# Patient Record
Sex: Female | Born: 1995 | Race: White | Hispanic: Yes | Smoking: Never smoker
Health system: Southern US, Community
[De-identification: ages and names within clinical notes are randomized; demographics above are authoritative.]

---

## 2019-09-08 ENCOUNTER — Emergency Department (HOSPITAL_COMMUNITY)
Admission: EM | Admit: 2019-09-08 | Discharge: 2019-09-08 | Disposition: A | Discharge status: Home / Self Care | Payer: No Typology Code available for payment source | Attending: Emergency Medicine | Admitting: Emergency Medicine

## 2019-09-08 ENCOUNTER — Other Ambulatory Visit: Payer: Self-pay

## 2019-09-08 ENCOUNTER — Encounter (HOSPITAL_COMMUNITY): Payer: Self-pay | Admitting: Emergency Medicine

## 2019-09-08 ENCOUNTER — Emergency Department (HOSPITAL_COMMUNITY): Payer: No Typology Code available for payment source

## 2019-09-08 DIAGNOSIS — Z23 Encounter for immunization: Secondary | ICD-10-CM | POA: Insufficient documentation

## 2019-09-08 DIAGNOSIS — M25562 Pain in left knee: Secondary | ICD-10-CM | POA: Insufficient documentation

## 2019-09-08 DIAGNOSIS — Y939 Activity, unspecified: Secondary | ICD-10-CM | POA: Insufficient documentation

## 2019-09-08 DIAGNOSIS — R519 Headache, unspecified: Secondary | ICD-10-CM | POA: Insufficient documentation

## 2019-09-08 DIAGNOSIS — R040 Epistaxis: Secondary | ICD-10-CM | POA: Diagnosis not present

## 2019-09-08 DIAGNOSIS — Y929 Unspecified place or not applicable: Secondary | ICD-10-CM | POA: Insufficient documentation

## 2019-09-08 DIAGNOSIS — S70212A Abrasion, left hip, initial encounter: Secondary | ICD-10-CM | POA: Diagnosis not present

## 2019-09-08 DIAGNOSIS — S70211A Abrasion, right hip, initial encounter: Secondary | ICD-10-CM | POA: Diagnosis not present

## 2019-09-08 DIAGNOSIS — R109 Unspecified abdominal pain: Secondary | ICD-10-CM | POA: Insufficient documentation

## 2019-09-08 DIAGNOSIS — M25561 Pain in right knee: Secondary | ICD-10-CM | POA: Insufficient documentation

## 2019-09-08 DIAGNOSIS — Y999 Unspecified external cause status: Secondary | ICD-10-CM | POA: Insufficient documentation

## 2019-09-08 LAB — I-STAT CHEM 8, ED
BUN: 8 mg/dL (ref 6–20)
Calcium, Ion: 1.17 mmol/L (ref 1.15–1.40)
Chloride: 109 mmol/L (ref 98–111)
Creatinine, Ser: 0.7 mg/dL (ref 0.44–1.00)
Glucose, Bld: 109 mg/dL — ABNORMAL HIGH (ref 70–99)
HCT: 44 % (ref 36.0–46.0)
Hemoglobin: 15 g/dL (ref 12.0–15.0)
Potassium: 3.8 mmol/L (ref 3.5–5.1)
Sodium: 144 mmol/L (ref 135–145)
TCO2: 19 mmol/L — ABNORMAL LOW (ref 22–32)

## 2019-09-08 LAB — LACTIC ACID, PLASMA: Lactic Acid, Venous: 2 mmol/L (ref 0.5–1.9)

## 2019-09-08 LAB — I-STAT BETA HCG BLOOD, ED (MC, WL, AP ONLY): I-stat hCG, quantitative: <5 m[IU]/mL (ref <5)

## 2019-09-08 LAB — SAMPLE TO BLOOD BANK

## 2019-09-08 LAB — URINALYSIS, ROUTINE W REFLEX MICROSCOPIC
Bacteria, UA: NONE SEEN
Bilirubin Urine: NEGATIVE
Glucose, UA: NEGATIVE mg/dL
Ketones, ur: NEGATIVE mg/dL
Leukocytes,Ua: NEGATIVE
Nitrite: NEGATIVE
Protein, ur: NEGATIVE mg/dL
Specific Gravity, Urine: 1.032 — ABNORMAL HIGH (ref 1.005–1.030)
pH: 5 (ref 5.0–8.0)

## 2019-09-08 LAB — COMPREHENSIVE METABOLIC PANEL
ALT: 12 U/L (ref 0–44)
AST: 16 U/L (ref 15–41)
Albumin: 4.2 g/dL (ref 3.5–5.0)
Alkaline Phosphatase: 67 U/L (ref 38–126)
Anion gap: 13 (ref 5–15)
BUN: 8 mg/dL (ref 6–20)
CO2: 17 mmol/L — ABNORMAL LOW (ref 22–32)
Calcium: 9.1 mg/dL (ref 8.9–10.3)
Chloride: 110 mmol/L (ref 98–111)
Creatinine, Ser: 0.49 mg/dL (ref 0.44–1.00)
GFR calc Af Amer: >60 mL/min (ref >60)
GFR calc non Af Amer: >60 mL/min (ref >60)
Glucose, Bld: 117 mg/dL — ABNORMAL HIGH (ref 70–99)
Potassium: 3.8 mmol/L (ref 3.5–5.1)
Sodium: 140 mmol/L (ref 135–145)
Total Bilirubin: 0.4 mg/dL (ref 0.3–1.2)
Total Protein: 7.6 g/dL (ref 6.5–8.1)

## 2019-09-08 LAB — CBC
HCT: 42.5 % (ref 36.0–46.0)
Hemoglobin: 14.5 g/dL (ref 12.0–15.0)
MCH: 31.2 pg (ref 26.0–34.0)
MCHC: 34.1 g/dL (ref 30.0–36.0)
MCV: 91.4 fL (ref 80.0–100.0)
Platelets: 291 10*3/uL (ref 150–400)
RBC: 4.65 MIL/uL (ref 3.87–5.11)
RDW: 12.5 % (ref 11.5–15.5)
WBC: 11.6 10*3/uL — ABNORMAL HIGH (ref 4.0–10.5)
nRBC: 0 % (ref 0.0–0.2)

## 2019-09-08 LAB — ETHANOL: Alcohol, Ethyl (B): 199 mg/dL — ABNORMAL HIGH (ref <10)

## 2019-09-08 LAB — PROTIME-INR
INR: 0.9 (ref 0.8–1.2)
Prothrombin Time: 12.3 seconds (ref 11.4–15.2)

## 2019-09-08 MED ORDER — NAPROXEN 250 MG PO TABS
500.0000 mg | ORAL_TABLET | Freq: Once | ORAL | Status: AC
  Administered 2019-09-08: 500 mg via ORAL
  Filled 2019-09-08: qty 2

## 2019-09-08 MED ORDER — FENTANYL CITRATE (PF) 100 MCG/2ML IJ SOLN
50.0000 ug | Freq: Once | INTRAMUSCULAR | Status: AC
  Administered 2019-09-08: 50 ug via INTRAVENOUS
  Filled 2019-09-08: qty 2

## 2019-09-08 MED ORDER — METHOCARBAMOL 500 MG PO TABS: 500.0000 mg | ORAL_TABLET | Freq: Two times a day (BID) | ORAL | 0 refills | Status: AC

## 2019-09-08 MED ORDER — SODIUM CHLORIDE 0.9 % IV BOLUS
1000.0000 mL | Freq: Once | INTRAVENOUS | Status: AC
  Administered 2019-09-08: 1000 mL via INTRAVENOUS

## 2019-09-08 MED ORDER — IOHEXOL 300 MG/ML  SOLN
85.0000 mL | Freq: Once | INTRAMUSCULAR | Status: AC | PRN
  Administered 2019-09-08: 85 mL via INTRAVENOUS

## 2019-09-08 MED ORDER — SODIUM CHLORIDE 0.9 % IV BOLUS
500.0000 mL | Freq: Once | INTRAVENOUS | Status: AC
  Administered 2019-09-08: 500 mL via INTRAVENOUS

## 2019-09-08 MED ORDER — NAPROXEN 250 MG PO TABS: 500.0000 mg | ORAL_TABLET | Freq: Two times a day (BID) | ORAL | 0 refills | Status: AC

## 2019-09-08 MED ORDER — TETANUS-DIPHTH-ACELL PERTUSSIS 5-2.5-18.5 LF-MCG/0.5 IM SUSP
0.5000 mL | Freq: Once | INTRAMUSCULAR | Status: AC
  Administered 2019-09-08: 0.5 mL via INTRAMUSCULAR
  Filled 2019-09-08: qty 0.5

## 2019-09-08 MED ORDER — METHOCARBAMOL 500 MG PO TABS
500.0000 mg | ORAL_TABLET | Freq: Once | ORAL | Status: AC
  Administered 2019-09-08: 07:00:00 500 mg via ORAL
  Filled 2019-09-08: qty 1

## 2019-09-08 NOTE — ED Notes (Signed)
t verbalized understanding of d/c instructions, follow up care, s/s requiring return to ed and prescriptions. Pt had no additional questions at this time. Pt transported to exit via wheelchair.
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
## 2019-09-08 NOTE — Discharge Instructions (Signed)
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
